# Patient Record
Sex: Male | Born: 1955 | Race: White | Hispanic: No | Marital: Single
Health system: Southern US, Community
[De-identification: ages and names within clinical notes are randomized; demographics above are authoritative.]

## PROBLEM LIST (undated history)

## (undated) DIAGNOSIS — F1910 Other psychoactive substance abuse, uncomplicated: Secondary | ICD-10-CM

## (undated) DIAGNOSIS — F1010 Alcohol abuse, uncomplicated: Secondary | ICD-10-CM

## (undated) DIAGNOSIS — F329 Major depressive disorder, single episode, unspecified: Secondary | ICD-10-CM

---

## 2014-08-01 ENCOUNTER — Encounter (HOSPITAL_COMMUNITY): Payer: Self-pay | Admitting: *Deleted

## 2014-08-01 ENCOUNTER — Emergency Department (HOSPITAL_COMMUNITY)
Admission: EM | Admit: 2014-08-01 | Discharge: 2014-08-02 | Disposition: A | Discharge status: Other Acute Inpatient Hospital | Payer: Self-pay | Attending: Emergency Medicine | Admitting: Emergency Medicine

## 2014-08-01 DIAGNOSIS — R45851 Suicidal ideations: Secondary | ICD-10-CM | POA: Insufficient documentation

## 2014-08-01 DIAGNOSIS — Z720 Tobacco use: Secondary | ICD-10-CM | POA: Insufficient documentation

## 2014-08-01 DIAGNOSIS — R440 Auditory hallucinations: Secondary | ICD-10-CM | POA: Insufficient documentation

## 2014-08-01 DIAGNOSIS — Z79899 Other long term (current) drug therapy: Secondary | ICD-10-CM | POA: Insufficient documentation

## 2014-08-01 HISTORY — DX: Alcohol abuse, uncomplicated: F10.10

## 2014-08-01 HISTORY — DX: Other psychoactive substance abuse, uncomplicated: F19.10

## 2014-08-01 HISTORY — DX: Major depressive disorder, single episode, unspecified: F32.9

## 2014-08-01 LAB — CBC WITH DIFFERENTIAL/PLATELET
BASOS PCT: 0 % (ref 0–1)
Basophils Absolute: 0 10*3/uL (ref 0.0–0.1)
EOS ABS: 0.2 10*3/uL (ref 0.0–0.7)
Eosinophils Relative: 2 % (ref 0–5)
HEMATOCRIT: 42.6 % (ref 39.0–52.0)
HEMOGLOBIN: 14.5 g/dL (ref 13.0–17.0)
LYMPHS ABS: 2.4 10*3/uL (ref 0.7–4.0)
Lymphocytes Relative: 28 % (ref 12–46)
MCH: 32 pg (ref 26.0–34.0)
MCHC: 34 g/dL (ref 30.0–36.0)
MCV: 94 fL (ref 78.0–100.0)
Monocytes Absolute: 1.1 10*3/uL — ABNORMAL HIGH (ref 0.1–1.0)
Monocytes Relative: 13 % — ABNORMAL HIGH (ref 3–12)
Neutro Abs: 4.8 10*3/uL (ref 1.7–7.7)
Neutrophils Relative %: 57 % (ref 43–77)
PLATELETS: 228 10*3/uL (ref 150–400)
RBC: 4.53 MIL/uL (ref 4.22–5.81)
RDW: 13.2 % (ref 11.5–15.5)
WBC: 8.5 10*3/uL (ref 4.0–10.5)

## 2014-08-01 LAB — RAPID URINE DRUG SCREEN, HOSP PERFORMED
Amphetamines: NOT DETECTED
Barbiturates: NOT DETECTED
Benzodiazepines: NOT DETECTED
Cocaine: NOT DETECTED
Opiates: NOT DETECTED
Tetrahydrocannabinol: NOT DETECTED

## 2014-08-01 LAB — URINALYSIS, ROUTINE W REFLEX MICROSCOPIC
BILIRUBIN URINE: NEGATIVE
Glucose, UA: NEGATIVE mg/dL
HGB URINE DIPSTICK: NEGATIVE
Ketones, ur: NEGATIVE mg/dL
Leukocytes, UA: NEGATIVE
Nitrite: NEGATIVE
Protein, ur: NEGATIVE mg/dL
Specific Gravity, Urine: 1.012 (ref 1.005–1.030)
Urobilinogen, UA: 0.2 mg/dL (ref 0.0–1.0)
pH: 7.5 (ref 5.0–8.0)

## 2014-08-01 LAB — COMPREHENSIVE METABOLIC PANEL
ALT: 29 U/L (ref 0–53)
ANION GAP: 7 (ref 5–15)
AST: 25 U/L (ref 0–37)
Albumin: 3.8 g/dL (ref 3.5–5.2)
Alkaline Phosphatase: 73 U/L (ref 39–117)
BUN: 18 mg/dL (ref 6–23)
CALCIUM: 9.5 mg/dL (ref 8.4–10.5)
CHLORIDE: 101 mmol/L (ref 96–112)
CO2: 28 mmol/L (ref 19–32)
Creatinine, Ser: 0.85 mg/dL (ref 0.50–1.35)
GFR calc Af Amer: >90 mL/min (ref >90)
GFR calc non Af Amer: >90 mL/min (ref >90)
Glucose, Bld: 92 mg/dL (ref 70–99)
POTASSIUM: 4.5 mmol/L (ref 3.5–5.1)
Sodium: 136 mmol/L (ref 135–145)
Total Bilirubin: 0.6 mg/dL (ref 0.3–1.2)
Total Protein: 7 g/dL (ref 6.0–8.3)

## 2014-08-01 LAB — ETHANOL: Alcohol, Ethyl (B): <5 mg/dL (ref 0–9)

## 2014-08-01 MED ORDER — ONDANSETRON HCL 4 MG PO TABS: 4.0000 mg | ORAL_TABLET | Freq: Three times a day (TID) | ORAL | Status: DC | PRN

## 2014-08-01 MED ORDER — BUSPIRONE HCL 10 MG PO TABS
10.0000 mg | ORAL_TABLET | Freq: Two times a day (BID) | ORAL | Status: DC
  Administered 2014-08-01 – 2014-08-02 (×2): 10 mg via ORAL
  Filled 2014-08-01 (×3): qty 1

## 2014-08-01 MED ORDER — ACETAMINOPHEN 325 MG PO TABS
650.0000 mg | ORAL_TABLET | ORAL | Status: DC | PRN
  Administered 2014-08-02: 650 mg via ORAL
  Filled 2014-08-01: qty 2

## 2014-08-01 MED ORDER — CITALOPRAM HYDROBROMIDE 10 MG PO TABS
20.0000 mg | ORAL_TABLET | Freq: Every day | ORAL | Status: DC
  Administered 2014-08-02: 20 mg via ORAL
  Filled 2014-08-01 (×2): qty 2

## 2014-08-01 MED ORDER — NALTREXONE HCL 50 MG PO TABS
50.0000 mg | ORAL_TABLET | Freq: Every day | ORAL | Status: DC
  Administered 2014-08-02: 50 mg via ORAL
  Filled 2014-08-01 (×3): qty 1

## 2014-08-01 NOTE — ED Notes (Signed)
Pt still waiting for dispo from [facility]

## 2014-08-01 NOTE — ED Notes (Signed)
Pt placed in scrubs and belongings at nursing station.

## 2014-08-01 NOTE — ED Notes (Signed)
Sitter requested with staffing.

## 2014-08-01 NOTE — Progress Notes (Addendum)
CSW faxed patient referral to: [facility], [facility], [facility], [facility], and [facility].  [facility]. [facility] - has beds OV - will have d/c tomorrow  [facility] - will have [facility]c tomorrow in am. Fax referral.  [facility] - might have [facility] tomorrow  CSW will continue to seek placement.  Chad [name], LCSWA Disposition staff 08/01/2014 7:50 PM

## 2014-08-01 NOTE — ED Notes (Signed)
MD Rancour at the bedside.   

## 2014-08-01 NOTE — ED Notes (Signed)
Pt presents c/o hearing "demonic" voices telling him to kill himself, pt denies SI or HI, states "he knows better".  [facility].  Last drink/drug use over 90 days ago.  Pt a x 4, NAD.

## 2014-08-01 NOTE — ED Notes (Signed)
PATIENT HAS BEEN IN [facility]IDENTIAL TREATMENT FOR THE PAST [facility] DAYS [facility]. HE IS UND[facility]. STATES [facility] HAS HAD A LOT MORE INTAKES THIS PAST WEEK. STATES IS MORE NOISE THERE THAN USUAL. STATES THAT THE NOISE ESPECIALLY AROUND MEAL TIME SEEMS TO TRIGGER HIS ANXIETY ATTACKS. STATES HE IS NOT SUICIDAL. STATES "THE VOICE MAY TELL ME TO KILL MYSELF BUT I SURE AINT GOING TO DO THAT". STATES HE DOESN'T HEAR VOICES ON A DAILY BASIS. STATES HE PLANS TO RETURN TO [facility] WHEN WE RELEASE HIM. STATES HE WILL BE THERE ANOTHER 2 MONTHS.

## 2014-08-01 NOTE — ED Notes (Signed)
Pt wanded by security at this time  ?

## 2014-08-01 NOTE — ED Provider Notes (Signed)
CSN: [id number]     Arrival date & time 08/01/14  [id number] History   First MD Initiated Contact with Patient 08/01/14 [phone]     Chief Complaint  Patient presents with  . Hallucinations     (Consider location/radiation/quality/duration/timing/severity/associated sxs/prior Treatment) HPI Comments: [facility] with hearing "demonic voices" since last night. He states they're telling him to kill himself though some voices tell him to support himself. He does not have a plan to hurt himself and says he knows better. [facility]. His last drink was over 90 days ago. His last drug use is over 90 days ago. He has a history of opiate, cocaine, marijuana abuse. He reports injecting drugs for more than 20 years. Denies any chest pain, headache, abdominal pain, nausea or vomiting. No focal weakness, numbness or tingling. Does not want hurt anyone else. Does not know if he has a history of bipolar disorder or schizophrenia.  The history is provided by the patient and a caregiver.    Past Medical History  Diagnosis Date  . Alcohol abuse   . Drug abuse   . Major depressive disorder    History reviewed. No pertinent past surgical history. History reviewed. No pertinent family history. History  Substance Use Topics  . Smoking status: Current Every Day Smoker  . Smokeless tobacco: Not on file  . Alcohol Use: No    Review of Systems  Constitutional: Negative for fever, activity change and appetite change.  HENT: Negative for congestion and rhinorrhea.   Respiratory: Negative for cough, chest tightness and shortness of breath.   Cardiovascular: Negative for chest pain.  Gastrointestinal: Negative for nausea, vomiting and abdominal pain.  Genitourinary: Negative for dysuria and hematuria.  Musculoskeletal: Negative for myalgias and arthralgias.  Skin: Negative for rash.  Neurological: Negative for dizziness, weakness and headaches.  Psychiatric/Behavioral:  Positive for suicidal ideas, hallucinations, behavioral problems and decreased concentration. The patient is nervous/anxious.   A complete 10 system review of systems was obtained and all systems are negative except as noted in the HPI and PMH.      Allergies  Benadryl and Vistaril  Home Medications   Prior to Admission medications   Medication Sig Start Date End Date Taking? Authorizing Provider  busPIRone (BUSPAR) 10 MG tablet Take 10 mg by mouth 2 (two) times daily. Take at 900 and 2100   Yes Historical Provider, MD  busPIRone (BUSPAR) 5 MG tablet Take 5 mg by mouth 2 (two) times daily. Take at 900 and 2100.   Yes Historical Provider, MD  citalopram (CELEXA) 20 MG tablet Take 20 mg by mouth daily.   Yes Historical Provider, MD  ibuprofen (ADVIL,MOTRIN) 200 MG tablet Take 800 mg by mouth every 6 (six) hours as needed for moderate pain.   Yes Historical Provider, MD  Multiple Vitamin (MULTIVITAMIN) capsule Take 1 capsule by mouth daily.   Yes Historical Provider, MD  naltrexone (DEPADE) 50 MG tablet Take 50 mg by mouth daily.   Yes Historical Provider, MD   BP 134/78 mmHg  Pulse 76  Temp(Src) 98.1 F (36.7 C) (Oral)  Resp 15  Ht 5\' 9"  (1.753 m)  Wt 186 lb (84.369 kg)  BMI 27.45 kg/m2  SpO2 95% Physical Exam  Constitutional: He is oriented to person, place, and time. He appears well-developed and well-nourished. No distress.  HENT:  Head: Normocephalic and atraumatic.  Mouth/Throat: Oropharynx is clear and moist. No oropharyngeal exudate.  Eyes: Conjunctivae  and EOM are normal. Pupils are equal, round, and reactive to light.  Neck: Normal range of motion. Neck supple.  No meningismus.  Cardiovascular: Normal rate, regular rhythm, normal heart sounds and intact distal pulses.   No murmur heard. Pulmonary/Chest: Effort normal and breath sounds normal. No respiratory distress.  Abdominal: Soft. There is no tenderness. There is no rebound and no guarding.  Musculoskeletal: Normal  range of motion. He exhibits no edema or tenderness.  Neurological: He is alert and oriented to person, place, and time. No cranial nerve deficit. He exhibits normal muscle tone. Coordination normal.  No ataxia on finger to nose bilaterally. No pronator drift. 5/5 strength throughout. CN 2-12 intact. Negative Romberg. Equal grip strength. Sensation intact. Gait is normal.   Skin: Skin is warm.  Psychiatric: He has a normal mood and affect. His behavior is normal.  Nursing note and vitals reviewed.   ED Course  Procedures (including critical care time) Labs Review Labs Reviewed  CBC WITH DIFFERENTIAL/PLATELET - Abnormal; Notable for the following:    Monocytes Relative 13 (*)    Monocytes Absolute 1.1 (*)    All other components within normal limits  COMPREHENSIVE METABOLIC PANEL  ETHANOL  URINE RAPID DRUG SCREEN (HOSP PERFORMED)  URINALYSIS, ROUTINE W REFLEX MICROSCOPIC    Imaging Review No results found.   EKG Interpretation None      MDM   Final diagnoses:  Hearing voices   Hearing voices since last night. Patient does not know if he has a history of schizophrenia or bipolar disorder. 90 days clean from drugs and alcohol.  Screening labs, discuss with TTS  Screening labs unremarkable. Patient is medically clear for behavioral health evaluation. Holding orders placed.    Glynn Octave, MD 08/01/14 [phone]

## 2014-08-01 NOTE — ED Notes (Signed)
Telepsych talking with pt at this time.

## 2014-08-01 NOTE — ED Notes (Signed)
Sitter at bedside.

## 2014-08-01 NOTE — BH Assessment (Signed)
Writer informed TTS Brandi of the consult.  

## 2014-08-01 NOTE — BH Assessment (Addendum)
Tele Assessment Note   Chad [name] is an 59 y.o. male. [facility]. Pt denies SI/HI. Pt reports visual and auditory hallucinations. Pt states that voices tell him "Do the world a favor and kill yourself." Pt reports that he sees shadow people. According to the Pt, he has had visual and auditory hallucinations all his life but they have worsended within the past 2 weeks. Pt admits to previous SA and alcohol use. [facility]. Pt states that he has been sober for 90 days. Pt reports that he is attending NA meetings daily. Pt states that he has been addicted to cocaine and alcohol for over 30 years. Pt reports being diagnosed with panic attacks and aniexty. Pt states that he is prescribed Celexa and Buspar. Pt reports feeling depressed because of past memories.  Pt states that he has [facility]. Pt states that he [facility].   Writer consulted with Dr. Lucianne MussKumar. Per Dr. Lucianne MussKumar the Pt meets inpatient criteria. [facility] beds [facility]. TTS to seek placement.  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 Alcohol abuse   . Drug abuse   . Major depressive disorder    Axis IV: occupational problems and problems related to social environment Axis V: 51-60 moderate symptoms  Past Medical History:  Past Medical History  Diagnosis Date  . Alcohol abuse   . Drug abuse   . Major depressive disorder     History reviewed. No pertinent past surgical history.  Family History: History reviewed. No pertinent family history.  Social History:  reports that he has been smoking.  He does not have any smokeless tobacco history on file. He reports that he does not drink alcohol or use illicit drugs.  Additional Social History:  Alcohol / Drug Use Pain Medications: Pt denies Prescriptions: Celexa, Buspar Over the  Counter: Pt denies Longest period of sobriety (when/how long): 90 days Negative Consequences of Use: Financial, Legal, Personal relationships, Work / School Substance #1 Name of Substance 1: Cocaine 1 - Age of First Use: 16 1 - Amount (size/oz): Unknown 1 - Frequency: previously daily 1 - Duration: 30+ 1 - Last Use / Amount: 90 days ago Substance #2 Name of Substance 2: Alcohol 2 - Age of First Use: 16 2 - Amount (size/oz): gallon of vodka a day 2 - Frequency: previously daily 2 - Duration: 30+ 2 - Last Use / Amount: 90 days ago  CIWA: CIWA-Ar BP: 131/71 mmHg Pulse Rate: 75 COWS:    PATIENT STRENGTHS: (choose at least two) Communication skills Motivation for treatment/growth  Allergies: Not on File  Home Medications:  (Not in a hospital admission)  OB/GYN Status:  No LMP for male patient.  General Assessment Data [facility] Is this a Tele or Face-to-Face Assessment?: Tele Assessment Is this an Initial Assessment or a Re-assessment for this encounter?: Initial Assessment Living Arrangements: Other (Comment) (Sober living home) Can pt return to current living arrangement?: Yes Admission Status: Voluntary Is patient capable of signing voluntary admission?: Yes Transfer from: Home Referral Source: Self/Family/Friend     [facility] Crisis Care Plan Living Arrangements: Other (Comment) (Sober living home) Name of Psychiatrist: [facility] Name of Therapist: [facility]  Education Status Is patient currently in school?: No Current Grade: NA Highest grade of school patient has completed: 9 Name  of school: NA Contact person: NA  Risk to self with the past 6 months Suicidal Ideation: Yes-Currently Present Suicidal Intent: No Is patient at risk for suicide?: No Suicidal Plan?: No Access to Means: No What has been your use of drugs/alcohol within the last 12 months?: Previously addicted to cocaine and alcohol Previous Attempts/Gestures: Yes How many times?:  1 Other Self Harm Risks: NA Triggers for Past Attempts: None known Intentional Self Injurious Behavior: None Family Suicide History: Yes Recent stressful life event(s): Other (Comment) (Trying to remain sober) Persecutory voices/beliefs?: Yes Depression: Yes Depression Symptoms: Tearfulness, Loss of interest in usual pleasures, Feeling worthless/self pity, Feeling angry/irritable Substance abuse history and/or treatment for substance abuse?: Yes Suicide prevention information given to non-admitted patients: Not applicable  Risk to Others within the past 6 months Homicidal Ideation: No Thoughts of Harm to Others: No Current Homicidal Intent: No Current Homicidal Plan: No Access to Homicidal Means: No Identified Victim: NA History of harm to others?: No Assessment of Violence: None Noted Violent Behavior Description: NA Does patient have access to weapons?: No Criminal Charges Pending?: No Does patient have a court date: No  Psychosis Hallucinations: Auditory, Visual Delusions: None noted  Mental Status Report Appearance/Hygiene: Unremarkable, In hospital gown Eye Contact: Good Motor Activity: Freedom of movement Speech: Logical/coherent Level of Consciousness: Alert Mood: Euthymic Affect: Appropriate to circumstance Anxiety Level: Moderate Thought Processes: Coherent, Relevant Judgement: Unimpaired Orientation: Person, Place, Time, Situation, Appropriate for developmental age Obsessive Compulsive Thoughts/Behaviors: None  Cognitive Functioning Concentration: Normal Memory: Recent Intact, Remote Intact IQ: Average Insight: Fair Impulse Control: Fair Appetite: Fair Weight Loss: 0 Weight Gain: 0 Sleep: No Change Total Hours of Sleep: 5 Vegetative Symptoms: None  ADLScreening [facility] Assessment Services) Patient's cognitive ability adequate to safely complete daily activities?: Yes Patient able to express need for assistance with ADLs?: Yes Independently performs  ADLs?: Yes (appropriate for developmental age)  Prior Inpatient Therapy Prior Inpatient Therapy: Yes Prior Therapy Dates: 2016 Prior Therapy Facilty/Provider([facility] Reason for Treatment: SA  Prior Outpatient Therapy Prior Outpatient Therapy: Yes Prior Therapy Dates: 2015 Prior Therapy Facilty/Provider(s): [facility] Reason for Treatment: SA  ADL Screening (condition at time of admission) Patient's cognitive ability adequate to safely complete daily activities?: Yes Is the patient deaf or have difficulty hearing?: No Does the patient have difficulty seeing, even when wearing glasses/contacts?: No Does the patient have difficulty concentrating, remembering, or making decisions?: No Patient able to express need for assistance with ADLs?: Yes Does the patient have difficulty dressing or bathing?: No Independently performs ADLs?: Yes (appropriate for developmental age)       Abuse/Neglect Assessment (Assessment to be complete while patient is alone) Physical Abuse: Denies Verbal Abuse: Denies Sexual Abuse: Denies Exploitation of patient/patient's resources: Denies Self-Neglect: Denies     Merchant navy officer (For Healthcare) Does patient have an advance directive?: No Would patient like information on creating an advanced directive?: No - patient declined information    Additional Information 1:1 In Past 12 Months?: No CIRT Risk: No Elopement Risk: No Does patient have medical clearance?: Yes     Disposition:  Disposition Initial Assessment Completed for this Encounter: Yes  Kinesha Auten D 08/01/2014 10:44 AM

## 2014-08-02 ENCOUNTER — Emergency Department (HOSPITAL_COMMUNITY): Payer: Self-pay

## 2014-08-02 MED ORDER — LORAZEPAM 1 MG PO TABS: 1.0000 mg | ORAL_TABLET | Freq: Three times a day (TID) | ORAL | Status: DC | PRN

## 2014-08-02 MED ORDER — NICOTINE 21 MG/24HR TD PT24
21.0000 mg | MEDICATED_PATCH | Freq: Every day | TRANSDERMAL | Status: DC
  Administered 2014-08-02: 21 mg via TRANSDERMAL
  Filled 2014-08-02: qty 1

## 2014-08-02 MED ORDER — ADULT MULTIVITAMIN W/MINERALS CH
1.0000 | ORAL_TABLET | Freq: Every day | ORAL | Status: DC
  Administered 2014-08-02: 1 via ORAL
  Filled 2014-08-02: qty 1

## 2014-08-02 MED ORDER — IBUPROFEN 400 MG PO TABS
600.0000 mg | ORAL_TABLET | Freq: Three times a day (TID) | ORAL | Status: DC | PRN
  Filled 2014-08-02 (×2): qty 1

## 2014-08-02 NOTE — ED Notes (Signed)
[facility]. Left message for Rich FuchsKaren Holloway per pt's request for her to call back so may notify her of pt's transfer.

## 2014-08-02 NOTE — ED Provider Notes (Addendum)
ED ECG REPORT   Date: 08/02/2014  Rate: 73  Rhythm: normal sinus rhythm  QRS Axis: normal  Intervals: normal  ST/T Wave abnormalities: nonspecific ST changes  Conduction Disutrbances:none  Narrative Interpretation:   Old EKG Reviewed: none available  I have personally reviewed the EKG tracing and agree with the computerized printout as noted. EKG would not cross over into MUSE  Vanetta MuldersScott Ripley Bogosian, MD 08/02/14 1119   [facility]. By Dr. Eliott Nineunham. Patient stable for transport. The facility requested patient to be placed on an IVC. Paperwork completed.  Vanetta MuldersScott Sherrise Liberto, MD 08/02/14 [phone]

## 2014-08-02 NOTE — Progress Notes (Signed)
[name] received call back from [name].  Chad [name] reported they have accepted patient but would like patient to be IVC'd.  [name] spoke with Chad [name], [place] [name] to request patient be IVC'd.  [name] reports wanting IVC in hands before we transport patient and before we call and give report.  IVC is to be faxed to [phone]  Accepted: Chad [name]  Accepting MD: Dr. Maricela Boharles [name] Report #: [phone]  All information was given to Chad [name].  Chad AmasEdith Ocean Kearley, LCSW Disposition Social Worker [phone]

## 2014-08-02 NOTE — ED Notes (Signed)
Pt resting quietly at the time. Sitter remains at bedside. No signs of distress noted. 

## 2014-08-02 NOTE — ED Notes (Signed)
[facility] - [phone] - aware of need for transportation after IVC papers have been served.

## 2014-08-02 NOTE — ED Notes (Signed)
[facility] may accept pt but needs CXR and EKG.

## 2014-08-02 NOTE — Progress Notes (Signed)
Ar 9:14AM CSW recevied call from [facility] who reported they are considering patient but they need a chest xray and an EKG.  CSW spoke to Flonnie HailstoneLeo, [place] [facility] CSW and relayed information.  At 10:25AM CSW received another call from [facility] requesting above information before they accept.  CSW reported to [facility] this was in progress and they will receive it when it is completed.  Adelene AmasEdith Sakina Briones, LCSW Disposition Social Worker [phone]

## 2020-10-01 ENCOUNTER — Emergency Department (HOSPITAL_COMMUNITY): Payer: No Typology Code available for payment source

## 2020-10-01 ENCOUNTER — Inpatient Hospital Stay (HOSPITAL_COMMUNITY)
Admission: EM | Admit: 2020-10-01 | Discharge: 2020-10-07 | DRG: 897 | Disposition: A | Discharge status: Home / Self Care | Payer: No Typology Code available for payment source | Attending: Family Medicine | Admitting: Family Medicine

## 2020-10-01 ENCOUNTER — Other Ambulatory Visit: Payer: Self-pay

## 2020-10-01 DIAGNOSIS — M4802 Spinal stenosis, cervical region: Secondary | ICD-10-CM | POA: Diagnosis present

## 2020-10-01 DIAGNOSIS — B1920 Unspecified viral hepatitis C without hepatic coma: Secondary | ICD-10-CM | POA: Diagnosis present

## 2020-10-01 DIAGNOSIS — T1490XA Injury, unspecified, initial encounter: Secondary | ICD-10-CM

## 2020-10-01 DIAGNOSIS — F10231 Alcohol dependence with withdrawal delirium: Principal | ICD-10-CM | POA: Diagnosis present

## 2020-10-01 DIAGNOSIS — F32A Depression, unspecified: Secondary | ICD-10-CM | POA: Diagnosis present

## 2020-10-01 DIAGNOSIS — F10939 Alcohol use, unspecified with withdrawal, unspecified: Secondary | ICD-10-CM | POA: Diagnosis present

## 2020-10-01 DIAGNOSIS — Z20822 Contact with and (suspected) exposure to covid-19: Secondary | ICD-10-CM | POA: Diagnosis present

## 2020-10-01 DIAGNOSIS — K7460 Unspecified cirrhosis of liver: Secondary | ICD-10-CM | POA: Diagnosis not present

## 2020-10-01 DIAGNOSIS — E872 Acidosis: Secondary | ICD-10-CM | POA: Diagnosis present

## 2020-10-01 DIAGNOSIS — E876 Hypokalemia: Secondary | ICD-10-CM | POA: Diagnosis not present

## 2020-10-01 DIAGNOSIS — F10239 Alcohol dependence with withdrawal, unspecified: Secondary | ICD-10-CM | POA: Diagnosis present

## 2020-10-01 DIAGNOSIS — K709 Alcoholic liver disease, unspecified: Secondary | ICD-10-CM | POA: Diagnosis present

## 2020-10-01 DIAGNOSIS — R4182 Altered mental status, unspecified: Secondary | ICD-10-CM | POA: Diagnosis present

## 2020-10-01 DIAGNOSIS — D539 Nutritional anemia, unspecified: Secondary | ICD-10-CM | POA: Diagnosis present

## 2020-10-01 DIAGNOSIS — E162 Hypoglycemia, unspecified: Secondary | ICD-10-CM | POA: Diagnosis not present

## 2020-10-01 DIAGNOSIS — R443 Hallucinations, unspecified: Secondary | ICD-10-CM | POA: Diagnosis not present

## 2020-10-01 DIAGNOSIS — S0181XA Laceration without foreign body of other part of head, initial encounter: Secondary | ICD-10-CM | POA: Diagnosis present

## 2020-10-01 DIAGNOSIS — D6959 Other secondary thrombocytopenia: Secondary | ICD-10-CM | POA: Diagnosis present

## 2020-10-01 DIAGNOSIS — F17210 Nicotine dependence, cigarettes, uncomplicated: Secondary | ICD-10-CM | POA: Diagnosis present

## 2020-10-01 LAB — CBC
HCT: 35.4 % — ABNORMAL LOW (ref 39.0–52.0)
Hemoglobin: 11.8 g/dL — ABNORMAL LOW (ref 13.0–17.0)
MCH: 35.4 pg — ABNORMAL HIGH (ref 26.0–34.0)
MCHC: 33.3 g/dL (ref 30.0–36.0)
MCV: 106.3 fL — ABNORMAL HIGH (ref 80.0–100.0)
Platelets: 101 10*3/uL — ABNORMAL LOW (ref 150–400)
RBC: 3.33 MIL/uL — ABNORMAL LOW (ref 4.22–5.81)
RDW: 12.8 % (ref 11.5–15.5)
WBC: 9.5 10*3/uL (ref 4.0–10.5)
nRBC: 0 % (ref 0.0–0.2)

## 2020-10-01 LAB — COMPREHENSIVE METABOLIC PANEL
ALT: 97 U/L — ABNORMAL HIGH (ref 0–44)
AST: 222 U/L — ABNORMAL HIGH (ref 15–41)
Albumin: 3.1 g/dL — ABNORMAL LOW (ref 3.5–5.0)
Alkaline Phosphatase: 73 U/L (ref 38–126)
Anion gap: 19 — ABNORMAL HIGH (ref 5–15)
BUN: 9 mg/dL (ref 8–23)
CO2: 16 mmol/L — ABNORMAL LOW (ref 22–32)
Calcium: 7.4 mg/dL — ABNORMAL LOW (ref 8.9–10.3)
Chloride: 105 mmol/L (ref 98–111)
Creatinine, Ser: 1.24 mg/dL (ref 0.61–1.24)
GFR, Estimated: >60 mL/min (ref >60)
Glucose, Bld: 132 mg/dL — ABNORMAL HIGH (ref 70–99)
Potassium: 3.9 mmol/L (ref 3.5–5.1)
Sodium: 140 mmol/L (ref 135–145)
Total Bilirubin: 0.7 mg/dL (ref 0.3–1.2)
Total Protein: 5.7 g/dL — ABNORMAL LOW (ref 6.5–8.1)

## 2020-10-01 LAB — I-STAT CHEM 8, ED
BUN: 9 mg/dL (ref 8–23)
Calcium, Ion: 0.94 mmol/L — ABNORMAL LOW (ref 1.15–1.40)
Chloride: 106 mmol/L (ref 98–111)
Creatinine, Ser: 1.3 mg/dL — ABNORMAL HIGH (ref 0.61–1.24)
Glucose, Bld: 124 mg/dL — ABNORMAL HIGH (ref 70–99)
HCT: 36 % — ABNORMAL LOW (ref 39.0–52.0)
Hemoglobin: 12.2 g/dL — ABNORMAL LOW (ref 13.0–17.0)
Potassium: 3.9 mmol/L (ref 3.5–5.1)
Sodium: 140 mmol/L (ref 135–145)
TCO2: 16 mmol/L — ABNORMAL LOW (ref 22–32)

## 2020-10-01 LAB — PROTIME-INR
INR: 1.2 (ref 0.8–1.2)
Prothrombin Time: 15.1 seconds (ref 11.4–15.2)

## 2020-10-01 LAB — TYPE AND SCREEN
ABO/RH(D): A POS
Antibody Screen: NEGATIVE

## 2020-10-01 LAB — LACTIC ACID, PLASMA: Lactic Acid, Venous: 9.9 mmol/L (ref 0.5–1.9)

## 2020-10-01 LAB — ETHANOL: Alcohol, Ethyl (B): 337 mg/dL (ref <10)

## 2020-10-01 MED ORDER — IOHEXOL 300 MG/ML  SOLN
100.0000 mL | Freq: Once | INTRAMUSCULAR | Status: AC | PRN
  Administered 2020-10-01: 100 mL via INTRAVENOUS

## 2020-10-01 MED ORDER — LACTATED RINGERS IV BOLUS
1000.0000 mL | Freq: Once | INTRAVENOUS | Status: AC
  Administered 2020-10-01: 1000 mL via INTRAVENOUS

## 2020-10-01 MED ORDER — LORAZEPAM 2 MG/ML IJ SOLN
2.0000 mg | Freq: Once | INTRAMUSCULAR | Status: AC
  Administered 2020-10-01: 2 mg via INTRAVENOUS

## 2020-10-01 MED ORDER — LIDOCAINE-EPINEPHRINE (PF) 2 %-1:200000 IJ SOLN
10.0000 mL | Freq: Once | INTRAMUSCULAR | Status: AC
  Administered 2020-10-01: 10 mL via INTRADERMAL
  Filled 2020-10-01: qty 20

## 2020-10-01 MED ORDER — LORAZEPAM 2 MG/ML IJ SOLN: 2.0000 mg | Freq: Once | INTRAMUSCULAR | Status: DC

## 2020-10-01 NOTE — ED Notes (Signed)
C-collar has been removed by provider. C-spine clear. Pt face and head cleaned. Pt is ready for suturing.

## 2020-10-01 NOTE — ED Notes (Addendum)
337 ETOH result called in by Lab

## 2020-10-01 NOTE — ED Notes (Signed)
Pt self reports high amount of ETOH consumption on a daily basis and is concerned about withdrawal. Pt noted to have tremors upon arrival. CIWA completed and pt received Ativan.
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
## 2020-10-01 NOTE — Progress Notes (Signed)
Orthopedic Tech Progress Note Patient Details:  Chad [name] 03-13-1956 [id number]  Patient ID: [name], male   DOB: 07/17/1955, 65 y.o.   MRN: [id number]   [name] 10/01/2020, 8:57 PM trauma

## 2020-10-01 NOTE — ED Notes (Signed)
Patient transported to CT 
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
## 2020-10-02 ENCOUNTER — Inpatient Hospital Stay (HOSPITAL_COMMUNITY): Payer: No Typology Code available for payment source

## 2020-10-02 ENCOUNTER — Other Ambulatory Visit (HOSPITAL_COMMUNITY): Payer: Self-pay

## 2020-10-02 ENCOUNTER — Observation Stay (HOSPITAL_BASED_OUTPATIENT_CLINIC_OR_DEPARTMENT_OTHER): Payer: No Typology Code available for payment source

## 2020-10-02 DIAGNOSIS — K709 Alcoholic liver disease, unspecified: Secondary | ICD-10-CM | POA: Diagnosis present

## 2020-10-02 DIAGNOSIS — E162 Hypoglycemia, unspecified: Secondary | ICD-10-CM | POA: Diagnosis not present

## 2020-10-02 DIAGNOSIS — E876 Hypokalemia: Secondary | ICD-10-CM | POA: Diagnosis not present

## 2020-10-02 DIAGNOSIS — E872 Acidosis: Secondary | ICD-10-CM | POA: Diagnosis present

## 2020-10-02 DIAGNOSIS — R443 Hallucinations, unspecified: Secondary | ICD-10-CM | POA: Diagnosis not present

## 2020-10-02 DIAGNOSIS — M4802 Spinal stenosis, cervical region: Secondary | ICD-10-CM | POA: Diagnosis present

## 2020-10-02 DIAGNOSIS — F32A Depression, unspecified: Secondary | ICD-10-CM | POA: Diagnosis present

## 2020-10-02 DIAGNOSIS — K7460 Unspecified cirrhosis of liver: Secondary | ICD-10-CM | POA: Diagnosis present

## 2020-10-02 DIAGNOSIS — F10231 Alcohol dependence with withdrawal delirium: Secondary | ICD-10-CM | POA: Diagnosis present

## 2020-10-02 DIAGNOSIS — I38 Endocarditis, valve unspecified: Secondary | ICD-10-CM

## 2020-10-02 DIAGNOSIS — F17210 Nicotine dependence, cigarettes, uncomplicated: Secondary | ICD-10-CM | POA: Diagnosis present

## 2020-10-02 DIAGNOSIS — D539 Nutritional anemia, unspecified: Secondary | ICD-10-CM | POA: Diagnosis present

## 2020-10-02 DIAGNOSIS — F10239 Alcohol dependence with withdrawal, unspecified: Secondary | ICD-10-CM | POA: Diagnosis present

## 2020-10-02 DIAGNOSIS — D6959 Other secondary thrombocytopenia: Secondary | ICD-10-CM | POA: Diagnosis present

## 2020-10-02 DIAGNOSIS — S0181XA Laceration without foreign body of other part of head, initial encounter: Secondary | ICD-10-CM | POA: Diagnosis present

## 2020-10-02 DIAGNOSIS — B1920 Unspecified viral hepatitis C without hepatic coma: Secondary | ICD-10-CM | POA: Diagnosis present

## 2020-10-02 DIAGNOSIS — Z20822 Contact with and (suspected) exposure to covid-19: Secondary | ICD-10-CM | POA: Diagnosis present

## 2020-10-02 DIAGNOSIS — R4182 Altered mental status, unspecified: Secondary | ICD-10-CM | POA: Diagnosis present

## 2020-10-02 DIAGNOSIS — F10939 Alcohol use, unspecified with withdrawal, unspecified: Secondary | ICD-10-CM | POA: Diagnosis present

## 2020-10-02 LAB — LACTIC ACID, PLASMA
Lactic Acid, Venous: 6.7 mmol/L (ref 0.5–1.9)
Lactic Acid, Venous: 6.6 mmol/L (ref 0.5–1.9)
Lactic Acid, Venous: 2.1 mmol/L (ref 0.5–1.9)

## 2020-10-02 LAB — COMPREHENSIVE METABOLIC PANEL
ALT: 82 U/L — ABNORMAL HIGH (ref 0–44)
AST: 190 U/L — ABNORMAL HIGH (ref 15–41)
Albumin: 2.8 g/dL — ABNORMAL LOW (ref 3.5–5.0)
Alkaline Phosphatase: 64 U/L (ref 38–126)
Anion gap: 16 — ABNORMAL HIGH (ref 5–15)
BUN: 7 mg/dL — ABNORMAL LOW (ref 8–23)
CO2: 16 mmol/L — ABNORMAL LOW (ref 22–32)
Calcium: 7.2 mg/dL — ABNORMAL LOW (ref 8.9–10.3)
Chloride: 106 mmol/L (ref 98–111)
Creatinine, Ser: 0.89 mg/dL (ref 0.61–1.24)
GFR, Estimated: >60 mL/min (ref >60)
Glucose, Bld: 92 mg/dL (ref 70–99)
Potassium: 4.1 mmol/L (ref 3.5–5.1)
Sodium: 138 mmol/L (ref 135–145)
Total Bilirubin: 1.3 mg/dL — ABNORMAL HIGH (ref 0.3–1.2)
Total Protein: 5.3 g/dL — ABNORMAL LOW (ref 6.5–8.1)

## 2020-10-02 LAB — RAPID URINE DRUG SCREEN, HOSP PERFORMED
Amphetamines: NOT DETECTED
Barbiturates: NOT DETECTED
Benzodiazepines: POSITIVE — AB
Cocaine: NOT DETECTED
Opiates: NOT DETECTED
Tetrahydrocannabinol: NOT DETECTED

## 2020-10-02 LAB — URINALYSIS, ROUTINE W REFLEX MICROSCOPIC
Bacteria, UA: NONE SEEN
Bilirubin Urine: NEGATIVE
Glucose, UA: NEGATIVE mg/dL
Ketones, ur: 5 mg/dL — AB
Leukocytes,Ua: NEGATIVE
Nitrite: NEGATIVE
Protein, ur: 30 mg/dL — AB
Specific Gravity, Urine: 1.028 (ref 1.005–1.030)
pH: 5 (ref 5.0–8.0)

## 2020-10-02 LAB — CBC
HCT: 29.5 % — ABNORMAL LOW (ref 39.0–52.0)
Hemoglobin: 10.3 g/dL — ABNORMAL LOW (ref 13.0–17.0)
MCH: 36.4 pg — ABNORMAL HIGH (ref 26.0–34.0)
MCHC: 34.9 g/dL (ref 30.0–36.0)
MCV: 104.2 fL — ABNORMAL HIGH (ref 80.0–100.0)
Platelets: 76 10*3/uL — ABNORMAL LOW (ref 150–400)
RBC: 2.83 MIL/uL — ABNORMAL LOW (ref 4.22–5.81)
RDW: 12.7 % (ref 11.5–15.5)
WBC: 9.3 10*3/uL (ref 4.0–10.5)
nRBC: 0 % (ref 0.0–0.2)

## 2020-10-02 LAB — ECHOCARDIOGRAM COMPLETE
Area-P 1/2: 7.59 cm2
Height: 70 in
Weight: 2800 oz

## 2020-10-02 LAB — MAGNESIUM: Magnesium: 1.6 mg/dL — ABNORMAL LOW (ref 1.7–2.4)

## 2020-10-02 LAB — RESP PANEL BY RT-PCR (FLU A&B, COVID) ARPGX2
Influenza A by PCR: NEGATIVE
Influenza B by PCR: NEGATIVE
SARS Coronavirus 2 by RT PCR: NEGATIVE

## 2020-10-02 LAB — CK: Total CK: 580 U/L — ABNORMAL HIGH (ref 49–397)

## 2020-10-02 LAB — ABO/RH: ABO/RH(D): A POS

## 2020-10-02 LAB — PHOSPHORUS: Phosphorus: 3.8 mg/dL (ref 2.5–4.6)

## 2020-10-02 LAB — HIV ANTIBODY (ROUTINE TESTING W REFLEX): HIV Screen 4th Generation wRfx: NONREACTIVE

## 2020-10-02 MED ORDER — LORAZEPAM 1 MG PO TABS
1.0000 mg | ORAL_TABLET | ORAL | Status: DC | PRN
  Administered 2020-10-03 (×2): 2 mg via ORAL
  Filled 2020-10-02 (×2): qty 2

## 2020-10-02 MED ORDER — LORAZEPAM 2 MG/ML IJ SOLN
4.0000 mg | Freq: Four times a day (QID) | INTRAMUSCULAR | Status: DC
  Administered 2020-10-02 – 2020-10-03 (×3): 4 mg via INTRAVENOUS
  Filled 2020-10-02 (×3): qty 2

## 2020-10-02 MED ORDER — LORAZEPAM 2 MG/ML IJ SOLN
1.0000 mg | INTRAMUSCULAR | Status: DC | PRN
Start: 2020-10-02 — End: 2020-10-04
  Administered 2020-10-02 (×3): 2 mg via INTRAVENOUS
  Administered 2020-10-02: 3 mg via INTRAVENOUS
  Administered 2020-10-02: 2 mg via INTRAVENOUS
  Administered 2020-10-02: 3 mg via INTRAVENOUS
  Administered 2020-10-02 (×2): 2 mg via INTRAVENOUS
  Administered 2020-10-02: 1 mg via INTRAVENOUS
  Administered 2020-10-02: 4 mg via INTRAVENOUS
  Administered 2020-10-03 (×2): 2 mg via INTRAVENOUS
  Administered 2020-10-03: 4 mg via INTRAVENOUS
  Administered 2020-10-03 (×2): 3 mg via INTRAVENOUS
  Administered 2020-10-03 – 2020-10-04 (×3): 4 mg via INTRAVENOUS
  Administered 2020-10-04 (×2): 3 mg via INTRAVENOUS
  Administered 2020-10-04: 4 mg via INTRAVENOUS
  Filled 2020-10-02 (×3): qty 2
  Filled 2020-10-02: qty 1
  Filled 2020-10-02 (×4): qty 2
  Filled 2020-10-02 (×3): qty 1
  Filled 2020-10-02: qty 2
  Filled 2020-10-02 (×3): qty 1
  Filled 2020-10-02 (×2): qty 2
  Filled 2020-10-02 (×2): qty 1
  Filled 2020-10-02 (×2): qty 2

## 2020-10-02 MED ORDER — THIAMINE HCL 100 MG PO TABS
100.0000 mg | ORAL_TABLET | Freq: Every day | ORAL | Status: DC
  Administered 2020-10-03 – 2020-10-07 (×3): 100 mg via ORAL
  Filled 2020-10-02 (×4): qty 1

## 2020-10-02 MED ORDER — FOLIC ACID 1 MG PO TABS
1.0000 mg | ORAL_TABLET | Freq: Every day | ORAL | Status: DC
  Administered 2020-10-02 – 2020-10-07 (×5): 1 mg via ORAL
  Filled 2020-10-02 (×5): qty 1

## 2020-10-02 MED ORDER — ENOXAPARIN SODIUM 40 MG/0.4ML IJ SOSY
40.0000 mg | PREFILLED_SYRINGE | INTRAMUSCULAR | Status: DC
  Administered 2020-10-02: 40 mg via SUBCUTANEOUS
  Filled 2020-10-02: qty 0.4

## 2020-10-02 MED ORDER — MAGNESIUM SULFATE 2 GM/50ML IV SOLN
2.0000 g | Freq: Once | INTRAVENOUS | Status: AC
  Administered 2020-10-02: 2 g via INTRAVENOUS
  Filled 2020-10-02: qty 50

## 2020-10-02 MED ORDER — ADULT MULTIVITAMIN W/MINERALS CH
1.0000 | ORAL_TABLET | Freq: Every day | ORAL | Status: DC
  Administered 2020-10-02 – 2020-10-07 (×5): 1 via ORAL
  Filled 2020-10-02 (×5): qty 1

## 2020-10-02 MED ORDER — LACTATED RINGERS IV BOLUS
1000.0000 mL | Freq: Once | INTRAVENOUS | Status: AC
  Administered 2020-10-02: 1000 mL via INTRAVENOUS

## 2020-10-02 MED ORDER — LORAZEPAM 1 MG PO TABS
4.0000 mg | ORAL_TABLET | Freq: Four times a day (QID) | ORAL | Status: DC
  Administered 2020-10-02: 4 mg via ORAL
  Filled 2020-10-02: qty 4

## 2020-10-02 MED ORDER — THIAMINE HCL 100 MG/ML IJ SOLN
100.0000 mg | Freq: Every day | INTRAMUSCULAR | Status: DC
  Administered 2020-10-02 – 2020-10-05 (×3): 100 mg via INTRAVENOUS
  Filled 2020-10-02 (×3): qty 2

## 2020-10-02 MED ORDER — SODIUM CHLORIDE 0.9 % IV SOLN: INTRAVENOUS | Status: DC
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
## 2020-10-02 NOTE — ED Notes (Signed)
Pt noted to be sitting up on stretcher and tremoring. Pt is trying to rip off cardiac leads; pt asked to lay down and reoriented to room.

## 2020-10-02 NOTE — Progress Notes (Signed)
  Echocardiogram 2D Echocardiogram has been performed.  Leta Jungling M 10/02/2020, 9:51 AM
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
## 2020-10-02 NOTE — ED Notes (Signed)
Attempted report x1. 

## 2020-10-02 NOTE — ED Notes (Signed)
Pt reoriented to room. Pt stating he needs to go to the store to fix a boat, however, pt aware he is in the hospital. Pt asking for [facility]; pt reminded that he is admitted and would benefit from remaining in the hospital until he feels better.

## 2020-10-02 NOTE — Progress Notes (Addendum)
FPTS Interim Progress Note  S:Checked on patient this evening and he was sleeping. He was just moved to room 5M13 for closer monitoring. He had mits in place but was not in restraints. Per nurse, she had just given him Ativan based on his CIWAs and had just placed the mits because he was getting agitated and trying to remove IV. Last CIWA 13 at 2224. In total he has received 31mg  over the past 24 hours.   O: BP 124/90 (BP Location: Left Arm)   Pulse 96   Temp 98.1 F (36.7 C) (Oral)   Resp 19   Ht 5\' 10"  (1.778 m)   Wt 79.4 kg   SpO2 97%   BMI 25.11 kg/m    General: sleeping, mits in place, NAD Resp: Normal WOB on RA   A/P: Alcohol withdrawal Last drink approx 5pm 5/26. Reports prior withdrawal seizures and ICU hospitalizations  - continue to monitor closely  - CIWA with prn Ativan - consider scheduled Benzo tomorrow   Please call intern pager below if there are any concerns or change in patient's status overnight   , DO 10/02/2020, 11:21 PM PGY-1, [facility] pager [phone]

## 2020-10-02 NOTE — Progress Notes (Addendum)
Spoke with ultrasound.  Abd u/s with elastography only done outpatient and cirrhosis is not indication for imaging.  -RUQ ultrasound liver/gallbladder when able  Called floor and spoke with RN to inform of plan.  Patient responding well to scheduled Ativan and prn dosing.  CIWA's remain high 7-15.  Has received total of 24 mg since admission.  Will need to monitor closely overnight.  Dana Allan, MD Family Medicine Residency

## 2020-10-02 NOTE — ED Notes (Signed)
Pt unable to get nipple piercing out. MRI aware.

## 2020-10-02 NOTE — Progress Notes (Signed)
   10/02/20 1022  Assess: MEWS Score  Temp 98.8 F (37.1 C)  BP (!) 129/92  Pulse Rate (!) 122  Resp 20  SpO2 99 %  O2 Device Room Air  Assess: MEWS Score  MEWS Temp 0  MEWS Systolic 0  MEWS Pulse 2  MEWS RR 0  MEWS LOC 0  MEWS Score 2  MEWS Score Color Yellow  Assess: if the MEWS score is Yellow or Red  Were vital signs taken at a resting state? Yes  Focused Assessment No change from prior assessment  Early Detection of Sepsis Score *See Row Information* Low  MEWS guidelines implemented *See Row Information* Yes  Treat  Pain Scale 0-10  Pain Score 0  Take Vital Signs  Increase Vital Sign Frequency  Yellow: Q 2hr X 2 then Q 4hr X 2, if remains yellow, continue Q 4hrs  Escalate  MEWS: Escalate Yellow: discuss with charge nurse/RN and consider discussing with provider and RRT  Notify: Charge Nurse/RN  Name of Charge Nurse/RN Notified Jasmine RN  Date Charge Nurse/RN Notified 10/02/20  Time Charge Nurse/RN Notified 1025  Document  Patient Outcome Not stable and remains on department  Progress note created (see row info) Yes

## 2020-10-02 NOTE — Progress Notes (Signed)
   10/01/20 2045  Clinical Encounter Type  Visited With Patient not available  Visit Type Trauma  Referral From Nurse  Consult/Referral To Chaplain  Chaplain responded to Level 2 page. The patient's family is not present at this time. Chaplain is not needed at this time; however, Chaplain remains available if needed. This note was prepared by Deneen Harts, M.Div..  For questions please contact by phone [phone].
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
## 2020-10-02 NOTE — ED Notes (Addendum)
Pt has urinated on himself several times and continues to get out of stretcher. Pt reoriented to room and male purewick placed. Pt asking about bird on the ceiling. Pt also noted to be picking at his fingers and hands. Pt tremors are pretty severe.

## 2020-10-03 DIAGNOSIS — F10231 Alcohol dependence with withdrawal delirium: Principal | ICD-10-CM

## 2020-10-03 LAB — CBC
HCT: 25.6 % — ABNORMAL LOW (ref 39.0–52.0)
HCT: 26.7 % — ABNORMAL LOW (ref 39.0–52.0)
Hemoglobin: 9.1 g/dL — ABNORMAL LOW (ref 13.0–17.0)
Hemoglobin: 9.3 g/dL — ABNORMAL LOW (ref 13.0–17.0)
MCH: 36.4 pg — ABNORMAL HIGH (ref 26.0–34.0)
MCH: 35.8 pg — ABNORMAL HIGH (ref 26.0–34.0)
MCHC: 35.5 g/dL (ref 30.0–36.0)
MCHC: 34.8 g/dL (ref 30.0–36.0)
MCV: 102.4 fL — ABNORMAL HIGH (ref 80.0–100.0)
MCV: 102.7 fL — ABNORMAL HIGH (ref 80.0–100.0)
Platelets: 43 10*3/uL — ABNORMAL LOW (ref 150–400)
Platelets: 42 10*3/uL — ABNORMAL LOW (ref 150–400)
RBC: 2.5 MIL/uL — ABNORMAL LOW (ref 4.22–5.81)
RBC: 2.6 MIL/uL — ABNORMAL LOW (ref 4.22–5.81)
RDW: 12.1 % (ref 11.5–15.5)
RDW: 11.8 % (ref 11.5–15.5)
WBC: 5.9 10*3/uL (ref 4.0–10.5)
WBC: 6.1 10*3/uL (ref 4.0–10.5)
nRBC: 0 % (ref 0.0–0.2)
nRBC: 0 % (ref 0.0–0.2)

## 2020-10-03 LAB — COMPREHENSIVE METABOLIC PANEL
ALT: 76 U/L — ABNORMAL HIGH (ref 0–44)
AST: 163 U/L — ABNORMAL HIGH (ref 15–41)
Albumin: 2.8 g/dL — ABNORMAL LOW (ref 3.5–5.0)
Alkaline Phosphatase: 72 U/L (ref 38–126)
Anion gap: 8 (ref 5–15)
BUN: 5 mg/dL — ABNORMAL LOW (ref 8–23)
CO2: 25 mmol/L (ref 22–32)
Calcium: 7.5 mg/dL — ABNORMAL LOW (ref 8.9–10.3)
Chloride: 105 mmol/L (ref 98–111)
Creatinine, Ser: 0.76 mg/dL (ref 0.61–1.24)
GFR, Estimated: >60 mL/min (ref >60)
Glucose, Bld: 75 mg/dL (ref 70–99)
Potassium: 3 mmol/L — ABNORMAL LOW (ref 3.5–5.1)
Sodium: 138 mmol/L (ref 135–145)
Total Bilirubin: 2.2 mg/dL — ABNORMAL HIGH (ref 0.3–1.2)
Total Protein: 5.6 g/dL — ABNORMAL LOW (ref 6.5–8.1)

## 2020-10-03 LAB — HEPATITIS PANEL, ACUTE
HCV Ab: REACTIVE — AB
Hep A IgM: NONREACTIVE
Hep B C IgM: NONREACTIVE
Hepatitis B Surface Ag: NONREACTIVE

## 2020-10-03 MED ORDER — POTASSIUM CHLORIDE CRYS ER 20 MEQ PO TBCR
40.0000 meq | EXTENDED_RELEASE_TABLET | Freq: Two times a day (BID) | ORAL | Status: DC
  Administered 2020-10-03: 40 meq via ORAL
  Filled 2020-10-03: qty 2

## 2020-10-03 MED ORDER — CHLORDIAZEPOXIDE HCL 25 MG PO CAPS
50.0000 mg | ORAL_CAPSULE | Freq: Three times a day (TID) | ORAL | Status: DC
  Administered 2020-10-03: 50 mg via ORAL
  Filled 2020-10-03: qty 2

## 2020-10-03 MED ORDER — CHLORDIAZEPOXIDE HCL 25 MG PO CAPS
25.0000 mg | ORAL_CAPSULE | Freq: Three times a day (TID) | ORAL | Status: DC
  Administered 2020-10-03: 25 mg via ORAL
  Filled 2020-10-03: qty 1

## 2020-10-03 MED ORDER — NICOTINE 21 MG/24HR TD PT24
21.0000 mg | MEDICATED_PATCH | Freq: Every day | TRANSDERMAL | Status: DC
  Administered 2020-10-03 – 2020-10-07 (×5): 21 mg via TRANSDERMAL
  Filled 2020-10-03 (×5): qty 1

## 2020-10-03 NOTE — Significant Event (Signed)
Rapid Response Event Note   Reason for Call :  CIWA>20  Pt has been given 14 mg ativan and 25mg  librium since 0844.  Initial Focused Assessment:  Pt lying in bed with eyes open, alert and oriented to self, place, and year, disoriented to situation. Pt is in no distress. He is fidgety, picking at his safety mittens, and talking to someone not in the room. He will answer simple questions approriately. He denies headache/chest pain/SOB. Lungs clear/diminished. Skin warm and dry. Per bedside RN, prior to my arrival, pt was sideways in the bed, attempting to get up, and not redirectable.   T-98.2, HR-109, BP-039/88, RR-18, SpO2-98% on RA, CIWA-24.   Interventions:  4mg  ativan IV now 50mg  librium po(2200 dose)  Plan of Care:  Pt VSS aside from some mild tachycardia. CIWA-24. Pt getting 4mg  ativan IV and his po librium-monitor response. If pt requires >20mg  ativan in 12 hours or he becomes unstable or a danger to self, may need to consult PCCM for possible transfer to the ICU. Plan of care discussed with bedside RN and MD at bedside. Continue to monitor closely and call RRT if further assistance needed.     Event Summary:   MD Notified: Dr. notified PTA RRT and came to bedside Call Time:2032 Arrival Time:2035 End Time:2104  , RN

## 2020-10-03 NOTE — Progress Notes (Signed)
Interim progress report  Received page from RN.  Called RN and was informed that patient CIWA was 21.  Advised that rapid response should be called per the CIWA protocol.  Went up to evaluate the patient.  He was resting comfortably in the bed, mittens placed bilaterally.  He did not appear anxious on my encounter, was not agitated, denied auditory or visual hallucinations. Did have tremors with extension of amrs and slight disorientation to place but able to tell me correct time (could have been correct this time because he was just recently asked by RN).  At the end of the encounter patient did say something that I was unable to fully comprehend to to his slurring but sounded like he was feeling something on his abdomen (tactile disturbance).   Per my assessment patient appears improved, though there is concern given that he has had persistently elevated CIWA's despite Ativan.  Librium has been started today.  Will need to continue to closely monitor CIWA score scores as he is at high risk for deterioration and transfer to ICU for potential Precedex.

## 2020-10-03 NOTE — Progress Notes (Signed)
FPTS Interim Progress Note  S: Received call from RN, pt with CIWA 24. Asked RN to call Rapid per CIWA protocol which she had already done.   Patient seen resting comfortable in bed, mittens remained in place bilaterally.  Patient was observed staring off to his right side but was able to turn his head to look at me and converse converse, said he was at [facility] in [place], year was 2022.  Eventually, patient returned to looking over to the right and seemed to be having a conversation with someone who was not there.  He did not appear anxious or agitated during my visit; however, RN had observed him trying to crawl out of bed and laying in odd position in bed.  Patient continues to have intention tremor and has been expressing concerns for tactile disturbances in his lower extremities.  O: BP 139/88 (BP Location: Left Arm)   Pulse (!) 109   Temp 98.2 F (36.8 C) (Oral)   Resp 19   Ht 5\' 10"  (1.778 m)   Wt 79.4 kg   SpO2 97%   BMI 25.11 kg/m     A/P: Patient received 4mg  Ativan for CIWA 24. Patient was able to be redirected, other than minor tachycardia of 109 bpm Vitals are stable.   Since 9PM on 10/02/2020 patient has received 27 mg of Ativan .  Plan to reassess patient within the next hour. Patient to receive an additional 50mg  Librium at 2200. If his situation worsens, vitals become unstable, or if he remains unchanged may consider reaching out to ICU.   , DO 10/03/2020, 8:58 PM PGY-3, [facility] Family Medicine Service pager [phone]
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
## 2020-10-04 DIAGNOSIS — D696 Thrombocytopenia, unspecified: Secondary | ICD-10-CM

## 2020-10-04 DIAGNOSIS — G9341 Metabolic encephalopathy: Secondary | ICD-10-CM

## 2020-10-04 DIAGNOSIS — E876 Hypokalemia: Secondary | ICD-10-CM

## 2020-10-04 DIAGNOSIS — T1490XA Injury, unspecified, initial encounter: Secondary | ICD-10-CM

## 2020-10-04 DIAGNOSIS — R410 Disorientation, unspecified: Secondary | ICD-10-CM

## 2020-10-04 LAB — CBC
HCT: 27.1 % — ABNORMAL LOW (ref 39.0–52.0)
Hemoglobin: 9.4 g/dL — ABNORMAL LOW (ref 13.0–17.0)
MCH: 36 pg — ABNORMAL HIGH (ref 26.0–34.0)
MCHC: 34.7 g/dL (ref 30.0–36.0)
MCV: 103.8 fL — ABNORMAL HIGH (ref 80.0–100.0)
Platelets: 45 10*3/uL — ABNORMAL LOW (ref 150–400)
RBC: 2.61 MIL/uL — ABNORMAL LOW (ref 4.22–5.81)
RDW: 11.7 % (ref 11.5–15.5)
WBC: 7 10*3/uL (ref 4.0–10.5)
nRBC: 0 % (ref 0.0–0.2)

## 2020-10-04 LAB — GLUCOSE, CAPILLARY
Glucose-Capillary: 86 mg/dL (ref 70–99)
Glucose-Capillary: 75 mg/dL (ref 70–99)
Glucose-Capillary: 121 mg/dL — ABNORMAL HIGH (ref 70–99)
Glucose-Capillary: 64 mg/dL — ABNORMAL LOW (ref 70–99)

## 2020-10-04 LAB — COMPREHENSIVE METABOLIC PANEL
ALT: 75 U/L — ABNORMAL HIGH (ref 0–44)
AST: 134 U/L — ABNORMAL HIGH (ref 15–41)
Albumin: 2.8 g/dL — ABNORMAL LOW (ref 3.5–5.0)
Alkaline Phosphatase: 75 U/L (ref 38–126)
Anion gap: 11 (ref 5–15)
BUN: 9 mg/dL (ref 8–23)
CO2: 24 mmol/L (ref 22–32)
Calcium: 7.6 mg/dL — ABNORMAL LOW (ref 8.9–10.3)
Chloride: 102 mmol/L (ref 98–111)
Creatinine, Ser: 0.77 mg/dL (ref 0.61–1.24)
GFR, Estimated: >60 mL/min (ref >60)
Glucose, Bld: 81 mg/dL (ref 70–99)
Potassium: 3 mmol/L — ABNORMAL LOW (ref 3.5–5.1)
Sodium: 137 mmol/L (ref 135–145)
Total Bilirubin: 1.7 mg/dL — ABNORMAL HIGH (ref 0.3–1.2)
Total Protein: 5.8 g/dL — ABNORMAL LOW (ref 6.5–8.1)

## 2020-10-04 LAB — MRSA PCR SCREENING: MRSA by PCR: NEGATIVE

## 2020-10-04 MED ORDER — LACTATED RINGERS IV BOLUS
1000.0000 mL | Freq: Once | INTRAVENOUS | Status: AC
  Administered 2020-10-04: 1000 mL via INTRAVENOUS

## 2020-10-04 MED ORDER — DEXTROSE 50 % IV SOLN: 12.5000 g | INTRAVENOUS | Status: AC

## 2020-10-04 MED ORDER — DEXMEDETOMIDINE HCL IN NACL 400 MCG/100ML IV SOLN
0.2000 ug/kg/h | INTRAVENOUS | Status: DC
  Administered 2020-10-04: 0.4 ug/kg/h via INTRAVENOUS

## 2020-10-04 MED ORDER — DEXTROSE 50 % IV SOLN
INTRAVENOUS | Status: AC
  Administered 2020-10-04: 12.5 g via INTRAVENOUS
  Filled 2020-10-04: qty 50

## 2020-10-04 MED ORDER — POTASSIUM CHLORIDE 20 MEQ PO PACK: 40.0000 meq | PACK | Freq: Two times a day (BID) | ORAL | Status: DC

## 2020-10-04 MED ORDER — CHLORHEXIDINE GLUCONATE CLOTH 2 % EX PADS
6.0000 | MEDICATED_PAD | Freq: Every day | CUTANEOUS | Status: DC
  Administered 2020-10-04 – 2020-10-07 (×5): 6 via TOPICAL

## 2020-10-04 MED ORDER — POTASSIUM CHLORIDE 10 MEQ/100ML IV SOLN
10.0000 meq | INTRAVENOUS | Status: AC
  Administered 2020-10-04 (×4): 10 meq via INTRAVENOUS
  Filled 2020-10-04 (×4): qty 100

## 2020-10-04 NOTE — Progress Notes (Signed)
   10/03/20 2358  Assess: MEWS Score  Temp 98.1 F (36.7 C)  BP 138/84  Pulse Rate (!) 116  ECG Heart Rate (!) 120  Resp 13  Level of Consciousness Alert  SpO2 96 %  O2 Device Room Air  Patient Activity (if Appropriate) In bed  Assess: MEWS Score  MEWS Temp 0  MEWS Systolic 0  MEWS Pulse 2  MEWS RR 1  MEWS LOC 0  MEWS Score 3  MEWS Score Color Yellow  Assess: if the MEWS score is Yellow or Red  Were vital signs taken at a resting state? Yes  Focused Assessment No change from prior assessment  Early Detection of Sepsis Score *See Row Information* Low  MEWS guidelines implemented *See Row Information* Yes  Treat  MEWS Interventions Administered prn meds/treatments  Pain Scale 0-10  Pain Score 0  Take Vital Signs  Increase Vital Sign Frequency  Yellow: Q 2hr X 2 then Q 4hr X 2, if remains yellow, continue Q 4hrs  Escalate  MEWS: Escalate Yellow: discuss with charge nurse/RN and consider discussing with provider and RRT  Notify: Charge Nurse/RN  Name of Charge Nurse/RN Notified Nikki Faucette  Date Charge Nurse/RN Notified 10/04/20  Time Charge Nurse/RN Notified 0002  Document  Patient Outcome Stabilized after interventions  Progress note created (see row info) Yes   Patient is on CIWA protocol. Will continue to monitor. No new interventions at this time.

## 2020-10-04 NOTE — Significant Event (Signed)
Rapid Response Event Note   Reason for Call :  High CIWA score Initial Focused Assessment:  Called by the RN and she was stating that the patient is having high CIWA scores. My CIWA score for him this morning was 23. He has received 18 mg so far today. The patient states that he takes ativan daily at home with his alcohol. The patient is not displaying any violent or aggressive behavior. He is trying to get out of bed. When I told him that he should stay in bed instead of walking around he stated "well what if I get on my belly and slither around like a snake?"   BP 123/97 ECG 101 O2 97 RR 20 Temp 98.1  Interventions:  CIWA scored Vitals taken     Event Summary:   MD Notified: Internal Medicine Call Time: 0750 Arrival Time: 1914 End Time: 0845  Andrey Spearman, RN

## 2020-10-04 NOTE — Progress Notes (Signed)
FPTS Interim Progress Note Received a page from RN that patient received patient received Ativan of 23 mg in last 24 hours and they called rapid response nurse who suggested to call ICU for admission.  S: Patient evaluated at bedside this morning after receiving page from RN.  He is able to state his name, age but is not oriented to place or time.  He states that he was drinking lots of alcohol at home and is having cravings here.  Denies any other complaints  O: Patient is seen and examined.  He seems like responding to internal stimuli having both auditory and visual hallucinations.  Has pleasant mood, not agitated. BP (!) 123/97 (BP Location: Left Arm)   Pulse (!) 101   Temp 98.1 F (36.7 C) (Oral)   Resp 13   Ht 5\' 10"  (1.778 m)   Wt 175 lb (79.4 kg)   SpO2 97%   BMI 25.11 kg/m     A/P: --Sitter 1:1 for observation -- Called ICU and reported about this patient and they plan to evaluate patient soon.  , MD 10/04/2020, 9:04 AM PGY-1, [facility] Family Medicine Service pager [phone]

## 2020-10-04 NOTE — Plan of Care (Signed)
  Problem: Education: Goal: Knowledge of disease or condition will improve Outcome: Progressing Goal: Understanding of discharge needs will improve Outcome: Progressing   Problem: Health Behavior/Discharge Planning: Goal: Ability to identify changes in lifestyle to reduce recurrence of condition will improve Outcome: Progressing Goal: Identification of resources available to assist in meeting health care needs will improve Outcome: Progressing   Problem: Physical Regulation: Goal: Complications related to the disease process, condition or treatment will be avoided or minimized Outcome: Progressing   Problem: Safety: Goal: Ability to remain free from injury will improve Outcome: Progressing   Problem: Education: Goal: Knowledge of General Education information will improve Description: Including pain rating scale, medication(s)/side effects and non-pharmacologic comfort measures Outcome: Progressing   Problem: Health Behavior/Discharge Planning: Goal: Ability to manage health-related needs will improve Outcome: Progressing   Problem: Clinical Measurements: Goal: Ability to maintain clinical measurements within normal limits will improve Outcome: Progressing Goal: Will remain free from infection Outcome: Progressing Goal: Diagnostic test results will improve Outcome: Progressing Goal: Respiratory complications will improve Outcome: Progressing Goal: Cardiovascular complication will be avoided Outcome: Progressing   Problem: Activity: Goal: Risk for activity intolerance will decrease Outcome: Progressing   Problem: Nutrition: Goal: Adequate nutrition will be maintained Outcome: Progressing   Problem: Coping: Goal: Level of anxiety will decrease Outcome: Progressing   Problem: Elimination: Goal: Will not experience complications related to bowel motility Outcome: Progressing Goal: Will not experience complications related to urinary retention Outcome:  Progressing   Problem: Pain Managment: Goal: General experience of comfort will improve Outcome: Progressing   Problem: Safety: Goal: Ability to remain free from injury will improve Outcome: Progressing   Problem: Skin Integrity: Goal: Risk for impaired skin integrity will decrease Outcome: Progressing   

## 2020-10-04 NOTE — Progress Notes (Signed)
eLink Physician-Brief Progress Note Patient Name: Chad [name] DOB: December 25, 1955 MRN: [id number]   Date of Service  10/04/2020  HPI/Events of Note  Has only Mag order for AM. Labs reviewed.   eICU Interventions  CMP, CBC ordered     Intervention Category Minor Interventions: Other:  Ranee Gosselin 10/04/2020, 10:56 PM
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
## 2020-10-04 NOTE — Progress Notes (Signed)
   10/03/20 2000  CIWA-Ar  BP 139/88  Pulse Rate (!) 109  Nausea and Vomiting 0  Tactile Disturbances 3  Tremor 5  Auditory Disturbances 2  Paroxysmal Sweats 0  Visual Disturbances 2  Anxiety 4  Headache, Fullness in Head 0  Agitation 5  Orientation and Clouding of Sensorium 3  CIWA-Ar Total 24   This was the patient's intial CIWA at the beginning of my shift (see above flowsheet). Patient was laying sideways in the bed with his legs over the rail. I asked patient what was he attempting to do. Patient replied with "I'm trying to get up so I can go for a walk." I asked the patient if he knew where he was. The patient stated "I'm at [facility]." I told the patient he was at [facility] in [place]. He replied with "ok." Patient began having a conversation with someone who was not in the room. I asked the patient "who are you talking with?" His reply was incomprehensible. Patient reports sensations in his feet, he has a visible tremor, he is hearing people who are nor present in the room, he has light sensitivity, moderate anxiety, and moderate agitation. He is disoriented to time and told me that he'd been in the hospital "for ten days". Patient given 4mg  of lorazepam (see MAR). Provider on call paged, Charge Nurse notified, and RRT called. RRT, Provider, and Charge Nurse came to the bedside to assess the patient. At the time of the initial CIWA I was directed to continue to monitor the patient.   0112: patient has received two aadditional doses of lorazepam (4mg  each). Patient has asked for cigarettes and alcohol. Patient has been reoriented to place and time repeatedly. Patient stated "I have a small plastic gun and it works, too." Patient was (is)wearing mitts and did (does) not have anything in his hands. Patient's HR has been as high as the 130s and is currently 114 bpm. CIWA at 2139 was 20 and CIWA at 0017 was 21. Provider paged. Awaiting response. Will continue to monitor.

## 2020-10-04 NOTE — Progress Notes (Signed)
   10/04/20 0223  CIWA-Ar  BP 102/89  Pulse Rate (!) 103  Nausea and Vomiting 0  Tactile Disturbances 2  Tremor 3  Auditory Disturbances 1  Paroxysmal Sweats 0  Visual Disturbances 2  Anxiety 4  Headache, Fullness in Head 0  Agitation 4  Orientation and Clouding of Sensorium 3  CIWA-Ar Total 19   I spoke with Dr Dareen Piano at approximately 0130 regarding patient's CIWA score trend. Currently, I am continuing to administer lorazepam as ordered according to the patient's CIWA score. Patient received 3mg  of lorazepam (see MAR) for the above CIWA score (19). Will continue to monitor.

## 2020-10-04 NOTE — Progress Notes (Signed)
Hypoglycemic Event  CBG: 64  Treatment: D50 25 mL (12.5 gm)  Symptoms: None  Follow-up CBG: Time: 2328 CBG Result: 121  Possible Reasons for Event: Inadequate meal intake
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
## 2020-10-04 NOTE — Progress Notes (Signed)
End of shift: Report given to Gillis Santa, RN Patient continues to be agitated. Patient received 4mg  of lorazepam at 0629 for a CIWA score of 23. Patient has stated repeatedly on night shift that he takes "ativan" when he's at home. When I asked the patient how much he takes he replied with "175 mg." I asked him "do you mean 1.75 mg?" He stated "that sounds about right." When I asked the patient "how many times a day do you take the ativan?" He replied with "3 times a day." Patient is confused so I am unsure of the validity of his statement, although I did share the information with , RN during bedside report.

## 2020-10-05 LAB — COMPREHENSIVE METABOLIC PANEL
ALT: 65 U/L — ABNORMAL HIGH (ref 0–44)
AST: 93 U/L — ABNORMAL HIGH (ref 15–41)
Albumin: 2.4 g/dL — ABNORMAL LOW (ref 3.5–5.0)
Alkaline Phosphatase: 73 U/L (ref 38–126)
Anion gap: 8 (ref 5–15)
BUN: 7 mg/dL — ABNORMAL LOW (ref 8–23)
CO2: 24 mmol/L (ref 22–32)
Calcium: 7.8 mg/dL — ABNORMAL LOW (ref 8.9–10.3)
Chloride: 107 mmol/L (ref 98–111)
Creatinine, Ser: 0.69 mg/dL (ref 0.61–1.24)
GFR, Estimated: >60 mL/min (ref >60)
Glucose, Bld: 80 mg/dL (ref 70–99)
Potassium: 2.9 mmol/L — ABNORMAL LOW (ref 3.5–5.1)
Sodium: 139 mmol/L (ref 135–145)
Total Bilirubin: 1.3 mg/dL — ABNORMAL HIGH (ref 0.3–1.2)
Total Protein: 5.2 g/dL — ABNORMAL LOW (ref 6.5–8.1)

## 2020-10-05 LAB — CBC WITH DIFFERENTIAL/PLATELET
Abs Immature Granulocytes: 0.02 10*3/uL (ref 0.00–0.07)
Basophils Absolute: 0 10*3/uL (ref 0.0–0.1)
Basophils Relative: 1 %
Eosinophils Absolute: 0.3 10*3/uL (ref 0.0–0.5)
Eosinophils Relative: 5 %
HCT: 26.1 % — ABNORMAL LOW (ref 39.0–52.0)
Hemoglobin: 8.9 g/dL — ABNORMAL LOW (ref 13.0–17.0)
Immature Granulocytes: 0 %
Lymphocytes Relative: 23 %
Lymphs Abs: 1.4 10*3/uL (ref 0.7–4.0)
MCH: 36.2 pg — ABNORMAL HIGH (ref 26.0–34.0)
MCHC: 34.1 g/dL (ref 30.0–36.0)
MCV: 106.1 fL — ABNORMAL HIGH (ref 80.0–100.0)
Monocytes Absolute: 0.5 10*3/uL (ref 0.1–1.0)
Monocytes Relative: 8 %
Neutro Abs: 3.8 10*3/uL (ref 1.7–7.7)
Neutrophils Relative %: 63 %
Platelets: 49 10*3/uL — ABNORMAL LOW (ref 150–400)
RBC: 2.46 MIL/uL — ABNORMAL LOW (ref 4.22–5.81)
RDW: 11.8 % (ref 11.5–15.5)
WBC: 6 10*3/uL (ref 4.0–10.5)
nRBC: 0 % (ref 0.0–0.2)

## 2020-10-05 LAB — GLUCOSE, CAPILLARY
Glucose-Capillary: 71 mg/dL (ref 70–99)
Glucose-Capillary: 73 mg/dL (ref 70–99)
Glucose-Capillary: 91 mg/dL (ref 70–99)
Glucose-Capillary: 119 mg/dL — ABNORMAL HIGH (ref 70–99)
Glucose-Capillary: 106 mg/dL — ABNORMAL HIGH (ref 70–99)

## 2020-10-05 LAB — MAGNESIUM: Magnesium: 1.7 mg/dL (ref 1.7–2.4)

## 2020-10-05 MED ORDER — ACETAMINOPHEN 325 MG PO TABS
650.0000 mg | ORAL_TABLET | Freq: Four times a day (QID) | ORAL | Status: DC | PRN
  Administered 2020-10-05 – 2020-10-06 (×2): 650 mg via ORAL
  Filled 2020-10-05 (×3): qty 2

## 2020-10-05 MED ORDER — LORAZEPAM 2 MG/ML IJ SOLN
1.0000 mg | INTRAMUSCULAR | Status: DC | PRN
Start: 2020-10-05 — End: 2020-10-06
  Administered 2020-10-05: 2 mg via INTRAVENOUS
  Filled 2020-10-05: qty 1

## 2020-10-05 MED ORDER — POTASSIUM CHLORIDE 10 MEQ/50ML IV SOLN: 10.0000 meq | INTRAVENOUS | Status: DC

## 2020-10-05 MED ORDER — DEXTROSE-NACL 5-0.9 % IV SOLN: INTRAVENOUS | Status: DC

## 2020-10-05 MED ORDER — POTASSIUM CHLORIDE 10 MEQ/100ML IV SOLN
10.0000 meq | INTRAVENOUS | Status: AC
  Administered 2020-10-05 (×8): 10 meq via INTRAVENOUS
  Filled 2020-10-05 (×8): qty 100

## 2020-10-05 MED ORDER — LORAZEPAM 1 MG PO TABS: 1.0000 mg | ORAL_TABLET | ORAL | Status: DC | PRN

## 2020-10-05 MED ORDER — MAGNESIUM SULFATE 2 GM/50ML IV SOLN
2.0000 g | Freq: Once | INTRAVENOUS | Status: AC
  Administered 2020-10-05: 2 g via INTRAVENOUS
  Filled 2020-10-05: qty 50

## 2020-10-05 NOTE — Progress Notes (Signed)
eLink Physician-Brief Progress Note Patient Name: Chad [name] DOB: Aug 16, 1955 MRN: [id number]   Date of Service  10/05/2020  HPI/Events of Note  Pt treated for hypoglycemia at 8p for CBG 63 > 1/2amp D50. Now CBG 70 and no MIVF infusing. Pt NPO d/t AMS.  eICU Interventions  Labs reviewed. Cr ok. Sodium 136.  D5NS at 50 ml/hr ordered for now.      Intervention Category Intermediate Interventions: Other:  Ranee Gosselin 10/05/2020, 3:24 AM

## 2020-10-05 NOTE — Progress Notes (Signed)
Daughter, Charlotte Sanes, notified of patient's transfer to [phone]. Updated provided and all questions answered. Patient then transferred with all personal belongings including cell phone, bank card, and jewelry.

## 2020-10-05 NOTE — Progress Notes (Signed)
FPTS Interim Progress Note  Intern received call from Dr Vassie Loll that patient will be ready to transfer out of the unit today.  FPTS will be happy to resume care of Mr Racca tomorrow 05/31 at 7 am.  CCM ok with plan. Appreciate CCM care of Mr Manfred while in ICU    Dana Allan, MD 10/05/2020, 12:32 PM PGY-2, [facility] Family Medicine Service pager [phone]

## 2020-10-05 NOTE — Plan of Care (Signed)
  Problem: Education: Goal: Knowledge of disease or condition will improve Outcome: Progressing Goal: Understanding of discharge needs will improve Outcome: Progressing   Problem: Health Behavior/Discharge Planning: Goal: Ability to identify changes in lifestyle to reduce recurrence of condition will improve Outcome: Progressing Goal: Identification of resources available to assist in meeting health care needs will improve Outcome: Progressing   Problem: Physical Regulation: Goal: Complications related to the disease process, condition or treatment will be avoided or minimized Outcome: Progressing   Problem: Safety: Goal: Ability to remain free from injury will improve Outcome: Progressing   Problem: Education: Goal: Knowledge of General Education information will improve Description: Including pain rating scale, medication(s)/side effects and non-pharmacologic comfort measures Outcome: Progressing   Problem: Health Behavior/Discharge Planning: Goal: Ability to manage health-related needs will improve Outcome: Progressing   Problem: Clinical Measurements: Goal: Ability to maintain clinical measurements within normal limits will improve Outcome: Progressing Goal: Will remain free from infection Outcome: Progressing Goal: Diagnostic test results will improve Outcome: Progressing Goal: Respiratory complications will improve Outcome: Progressing Goal: Cardiovascular complication will be avoided Outcome: Progressing   Problem: Activity: Goal: Risk for activity intolerance will decrease Outcome: Progressing   Problem: Nutrition: Goal: Adequate nutrition will be maintained Outcome: Progressing   Problem: Coping: Goal: Level of anxiety will decrease Outcome: Progressing   Problem: Elimination: Goal: Will not experience complications related to bowel motility Outcome: Progressing Goal: Will not experience complications related to urinary retention Outcome:  Progressing   Problem: Pain Managment: Goal: General experience of comfort will improve Outcome: Progressing   Problem: Safety: Goal: Ability to remain free from injury will improve Outcome: Progressing   Problem: Skin Integrity: Goal: Risk for impaired skin integrity will decrease Outcome: Progressing   

## 2020-10-05 NOTE — Progress Notes (Signed)
eLink Physician-Brief Progress Note Patient Name: Chad [name] DOB: Dec 03, 1955 MRN: [id number]   Date of Service  10/05/2020  HPI/Events of Note  Low K and Mag, normal Creatinine  eICU Interventions  K/Mag replacement protocol ordered.      Intervention Category Intermediate Interventions: Electrolyte abnormality - evaluation and management  Ranee Gosselin 10/05/2020, 4:06 AM
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
## 2020-10-06 ENCOUNTER — Inpatient Hospital Stay (HOSPITAL_COMMUNITY): Payer: No Typology Code available for payment source

## 2020-10-06 DIAGNOSIS — K7030 Alcoholic cirrhosis of liver without ascites: Secondary | ICD-10-CM

## 2020-10-06 DIAGNOSIS — K7460 Unspecified cirrhosis of liver: Secondary | ICD-10-CM

## 2020-10-06 DIAGNOSIS — F10239 Alcohol dependence with withdrawal, unspecified: Secondary | ICD-10-CM

## 2020-10-06 LAB — CBC
HCT: 25.3 % — ABNORMAL LOW (ref 39.0–52.0)
Hemoglobin: 8.9 g/dL — ABNORMAL LOW (ref 13.0–17.0)
MCH: 37.1 pg — ABNORMAL HIGH (ref 26.0–34.0)
MCHC: 35.2 g/dL (ref 30.0–36.0)
MCV: 105.4 fL — ABNORMAL HIGH (ref 80.0–100.0)
Platelets: 72 10*3/uL — ABNORMAL LOW (ref 150–400)
RBC: 2.4 MIL/uL — ABNORMAL LOW (ref 4.22–5.81)
RDW: 11.7 % (ref 11.5–15.5)
WBC: 8.5 10*3/uL (ref 4.0–10.5)
nRBC: 0 % (ref 0.0–0.2)

## 2020-10-06 LAB — GLUCOSE, CAPILLARY
Glucose-Capillary: 110 mg/dL — ABNORMAL HIGH (ref 70–99)
Glucose-Capillary: 153 mg/dL — ABNORMAL HIGH (ref 70–99)
Glucose-Capillary: 82 mg/dL (ref 70–99)
Glucose-Capillary: 91 mg/dL (ref 70–99)
Glucose-Capillary: 92 mg/dL (ref 70–99)
Glucose-Capillary: 98 mg/dL (ref 70–99)

## 2020-10-06 LAB — PHOSPHORUS: Phosphorus: 3.1 mg/dL (ref 2.5–4.6)

## 2020-10-06 LAB — BASIC METABOLIC PANEL
Anion gap: 7 (ref 5–15)
BUN: <5 mg/dL — ABNORMAL LOW (ref 8–23)
CO2: 23 mmol/L (ref 22–32)
Calcium: 8.2 mg/dL — ABNORMAL LOW (ref 8.9–10.3)
Chloride: 105 mmol/L (ref 98–111)
Creatinine, Ser: 0.67 mg/dL (ref 0.61–1.24)
GFR, Estimated: >60 mL/min (ref >60)
Glucose, Bld: 91 mg/dL (ref 70–99)
Potassium: 3.1 mmol/L — ABNORMAL LOW (ref 3.5–5.1)
Sodium: 135 mmol/L (ref 135–145)

## 2020-10-06 LAB — MAGNESIUM: Magnesium: 1.7 mg/dL (ref 1.7–2.4)

## 2020-10-06 MED ORDER — MAGNESIUM SULFATE 2 GM/50ML IV SOLN
2.0000 g | Freq: Once | INTRAVENOUS | Status: AC
  Administered 2020-10-06: 2 g via INTRAVENOUS
  Filled 2020-10-06: qty 50

## 2020-10-06 MED ORDER — LORAZEPAM 2 MG/ML IJ SOLN: 1.0000 mg | Freq: Once | INTRAMUSCULAR | Status: DC | PRN

## 2020-10-06 MED ORDER — POTASSIUM CHLORIDE 10 MEQ/100ML IV SOLN
10.0000 meq | INTRAVENOUS | Status: AC
  Administered 2020-10-06 (×4): 10 meq via INTRAVENOUS
  Filled 2020-10-06 (×4): qty 100

## 2020-10-06 MED ORDER — GADOBUTROL 1 MMOL/ML IV SOLN
7.0000 mL | Freq: Once | INTRAVENOUS | Status: AC | PRN
  Administered 2020-10-06: 7 mL via INTRAVENOUS

## 2020-10-06 MED ORDER — ENOXAPARIN SODIUM 40 MG/0.4ML IJ SOSY
40.0000 mg | PREFILLED_SYRINGE | INTRAMUSCULAR | Status: DC
  Administered 2020-10-06 – 2020-10-07 (×2): 40 mg via SUBCUTANEOUS
  Filled 2020-10-06 (×2): qty 0.4
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
## 2020-10-06 NOTE — Progress Notes (Signed)
Interim Progress Note  Discussed with neurosurgery, Dr. Conchita Paris.  Recommends further evaluation with MRI C-spine as recommended in radiology report.  No formal consult at this time, however if results are concerning will repage and Neurosurgery for consult/recommendations.   Suggests that patient follow up outpatient for serial x-rays.
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
## 2020-10-07 ENCOUNTER — Other Ambulatory Visit (HOSPITAL_COMMUNITY): Payer: Self-pay

## 2020-10-07 ENCOUNTER — Inpatient Hospital Stay (HOSPITAL_COMMUNITY): Payer: No Typology Code available for payment source

## 2020-10-07 DIAGNOSIS — T1490XA Injury, unspecified, initial encounter: Secondary | ICD-10-CM

## 2020-10-07 LAB — COMPREHENSIVE METABOLIC PANEL
ALT: 52 U/L — ABNORMAL HIGH (ref 0–44)
AST: 52 U/L — ABNORMAL HIGH (ref 15–41)
Albumin: 2.7 g/dL — ABNORMAL LOW (ref 3.5–5.0)
Alkaline Phosphatase: 72 U/L (ref 38–126)
Anion gap: 11 (ref 5–15)
BUN: 5 mg/dL — ABNORMAL LOW (ref 8–23)
CO2: 22 mmol/L (ref 22–32)
Calcium: 8.5 mg/dL — ABNORMAL LOW (ref 8.9–10.3)
Chloride: 102 mmol/L (ref 98–111)
Creatinine, Ser: 0.73 mg/dL (ref 0.61–1.24)
GFR, Estimated: >60 mL/min (ref >60)
Glucose, Bld: 91 mg/dL (ref 70–99)
Potassium: 3.2 mmol/L — ABNORMAL LOW (ref 3.5–5.1)
Sodium: 135 mmol/L (ref 135–145)
Total Bilirubin: 0.5 mg/dL (ref 0.3–1.2)
Total Protein: 5.5 g/dL — ABNORMAL LOW (ref 6.5–8.1)

## 2020-10-07 LAB — CBC
HCT: 26 % — ABNORMAL LOW (ref 39.0–52.0)
Hemoglobin: 8.8 g/dL — ABNORMAL LOW (ref 13.0–17.0)
MCH: 36.2 pg — ABNORMAL HIGH (ref 26.0–34.0)
MCHC: 33.8 g/dL (ref 30.0–36.0)
MCV: 107 fL — ABNORMAL HIGH (ref 80.0–100.0)
Platelets: 108 10*3/uL — ABNORMAL LOW (ref 150–400)
RBC: 2.43 MIL/uL — ABNORMAL LOW (ref 4.22–5.81)
RDW: 12.3 % (ref 11.5–15.5)
WBC: 6.2 10*3/uL (ref 4.0–10.5)
nRBC: 0 % (ref 0.0–0.2)

## 2020-10-07 LAB — CULTURE, BLOOD (ROUTINE X 2)
Culture: NO GROWTH
Culture: NO GROWTH
Special Requests: ADEQUATE

## 2020-10-07 LAB — GLUCOSE, CAPILLARY
Glucose-Capillary: 102 mg/dL — ABNORMAL HIGH (ref 70–99)
Glucose-Capillary: 88 mg/dL (ref 70–99)
Glucose-Capillary: 92 mg/dL (ref 70–99)
Glucose-Capillary: 92 mg/dL (ref 70–99)
Glucose-Capillary: 96 mg/dL (ref 70–99)

## 2020-10-07 MED ORDER — POTASSIUM CHLORIDE CRYS ER 20 MEQ PO TBCR
20.0000 meq | EXTENDED_RELEASE_TABLET | Freq: Every day | ORAL | 0 refills | Status: AC
  Filled 2020-10-07: qty 30, 30d supply, fill #0

## 2020-10-07 MED ORDER — FOLIC ACID 1 MG PO TABS
1.0000 mg | ORAL_TABLET | Freq: Every day | ORAL | 0 refills | Status: AC
  Filled 2020-10-07: qty 30, 30d supply, fill #0

## 2020-10-07 MED ORDER — POTASSIUM CHLORIDE CRYS ER 20 MEQ PO TBCR
40.0000 meq | EXTENDED_RELEASE_TABLET | Freq: Once | ORAL | Status: AC
  Administered 2020-10-07: 40 meq via ORAL
  Filled 2020-10-07: qty 2

## 2020-10-07 MED ORDER — THIAMINE HCL 100 MG PO TABS
100.0000 mg | ORAL_TABLET | Freq: Every day | ORAL | 0 refills | Status: AC
  Filled 2020-10-07: qty 30, 30d supply, fill #0

## 2020-10-07 MED ORDER — NALTREXONE HCL 50 MG PO TABS
50.0000 mg | ORAL_TABLET | Freq: Every day | ORAL | 0 refills | Status: AC
  Filled 2020-10-07: qty 30, 30d supply, fill #0

## 2020-10-07 NOTE — Progress Notes (Signed)
Pt off floor headed to MRI at this time

## 2020-10-07 NOTE — Progress Notes (Signed)
IV removal well tolerated, patient given discharge instructions reviewed with nurse, clothes provided to patient and taxi voucher utilized. Patient states he now has all of his belongings.

## 2020-10-07 NOTE — TOC Transition Note (Signed)
Transition of Care [facility]) - CM/SW Discharge Note   Patient Details  Name: Chad [name] MRN: [id number] Date of Birth: 02-Aug-1955  [facility] of [facility]) CM/SW Contact:  Milinda Antis, [place] Phone Number: 10/07/2020, 2:28 PM   Clinical Narrative:    Patient will DC to: Home Anticipated DC date: 10/07/2020 Transport CE:[facility]   Per MD patient ready for DC home with home health.  CSW met with the patient at bedside.  The patient verified home address.  Patient reports living alone.  Patient informed CSW that his telephone number is [phone].  The patient did not have a PCP.  CSW scheduled an appointment with the [facility] for November 17, 2020 @ 9:30am with Archie Patten.  The patient does not have insurance and the charity home health agency do not provide service in his area.  Attending notified of the need for outpatient PT.    CSW provided charity clothing for the patient to return home.  Patient was given SA resources and a taxi voucher given for transport home.   CSW will sign off for now as social work intervention is no longer needed. Please consult [place] again if new needs arise.     Final next level of care: Home w Home Health Services Barriers to Discharge: No Barriers Identified   Patient Goals and CMS Choice   CMS Medicare.gov Compare Post Acute Care list provided to:: Patient Choice offered to / list presented to : Patient  Discharge Placement                Patient to be transferred to facility by: Taxi Name of family member notified: patient alert and oriented x4 Patient and family notified of of transfer: 10/07/20  Discharge Plan and Services                                     Social Determinants of Health (SDOH) Interventions     Readmission Risk Interventions No flowsheet data found.
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
## 2020-10-07 NOTE — Plan of Care (Signed)
  Problem: Education: Goal: Knowledge of disease or condition will improve Outcome: Adequate for Discharge Goal: Understanding of discharge needs will improve Outcome: Adequate for Discharge   Problem: Health Behavior/Discharge Planning: Goal: Ability to identify changes in lifestyle to reduce recurrence of condition will improve Outcome: Adequate for Discharge Goal: Identification of resources available to assist in meeting health care needs will improve Outcome: Adequate for Discharge   Problem: Physical Regulation: Goal: Complications related to the disease process, condition or treatment will be avoided or minimized Outcome: Adequate for Discharge   Problem: Safety: Goal: Ability to remain free from injury will improve Outcome: Adequate for Discharge   Problem: Education: Goal: Knowledge of General Education information will improve Description: Including pain rating scale, medication(s)/side effects and non-pharmacologic comfort measures Outcome: Adequate for Discharge   Problem: Health Behavior/Discharge Planning: Goal: Ability to manage health-related needs will improve Outcome: Adequate for Discharge   Problem: Clinical Measurements: Goal: Ability to maintain clinical measurements within normal limits will improve Outcome: Adequate for Discharge Goal: Will remain free from infection Outcome: Adequate for Discharge Goal: Diagnostic test results will improve Outcome: Adequate for Discharge Goal: Respiratory complications will improve Outcome: Adequate for Discharge Goal: Cardiovascular complication will be avoided Outcome: Adequate for Discharge   Problem: Activity: Goal: Risk for activity intolerance will decrease Outcome: Adequate for Discharge   Problem: Nutrition: Goal: Adequate nutrition will be maintained Outcome: Adequate for Discharge   Problem: Coping: Goal: Level of anxiety will decrease Outcome: Adequate for Discharge   Problem:  Elimination: Goal: Will not experience complications related to bowel motility Outcome: Adequate for Discharge Goal: Will not experience complications related to urinary retention Outcome: Adequate for Discharge   Problem: Pain Managment: Goal: General experience of comfort will improve Outcome: Adequate for Discharge   Problem: Safety: Goal: Ability to remain free from injury will improve Outcome: Adequate for Discharge   Problem: Skin Integrity: Goal: Risk for impaired skin integrity will decrease Outcome: Adequate for Discharge   
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
## 2020-11-17 ENCOUNTER — Ambulatory Visit: Payer: Self-pay | Attending: Nurse Practitioner | Admitting: Nurse Practitioner

## 2020-11-17 ENCOUNTER — Other Ambulatory Visit: Payer: Self-pay

## 2022-09-13 ENCOUNTER — Other Ambulatory Visit: Payer: Self-pay
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
